# Patient Record
Sex: Male | Born: 2000 | Race: White | Hispanic: No | Marital: Single | Smoking: Never smoker
Health system: Southern US, Community
[De-identification: ages and names within clinical notes are randomized; demographics above are authoritative.]

---
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
## 2022-02-26 ENCOUNTER — Other Ambulatory Visit: Payer: Self-pay

## 2022-02-26 ENCOUNTER — Emergency Department
Admission: EM | Admit: 2022-02-26 | Discharge: 2022-02-26 | Disposition: A | Discharge status: Home / Self Care | Payer: Medicaid Other | Attending: Emergency Medicine | Admitting: Emergency Medicine

## 2022-02-26 DIAGNOSIS — K2100 Gastro-esophageal reflux disease with esophagitis, without bleeding: Secondary | ICD-10-CM | POA: Insufficient documentation

## 2022-02-26 DIAGNOSIS — K219 Gastro-esophageal reflux disease without esophagitis: Secondary | ICD-10-CM

## 2022-02-26 DIAGNOSIS — E860 Dehydration: Secondary | ICD-10-CM | POA: Diagnosis not present

## 2022-02-26 DIAGNOSIS — R42 Dizziness and giddiness: Secondary | ICD-10-CM | POA: Diagnosis present

## 2022-02-26 LAB — BASIC METABOLIC PANEL
Anion gap: 10 (ref 5–15)
BUN: 9 mg/dL (ref 6–20)
CO2: 27 mmol/L (ref 22–32)
Calcium: 10 mg/dL (ref 8.9–10.3)
Chloride: 104 mmol/L (ref 98–111)
Creatinine, Ser: 0.74 mg/dL (ref 0.61–1.24)
GFR, Estimated: >60 mL/min (ref >60)
Glucose, Bld: 118 mg/dL — ABNORMAL HIGH (ref 70–99)
Potassium: 4.2 mmol/L (ref 3.5–5.1)
Sodium: 141 mmol/L (ref 135–145)

## 2022-02-26 LAB — CBC
HCT: 46.2 % (ref 39.0–52.0)
Hemoglobin: 15.4 g/dL (ref 13.0–17.0)
MCH: 30.3 pg (ref 26.0–34.0)
MCHC: 33.3 g/dL (ref 30.0–36.0)
MCV: 90.9 fL (ref 80.0–100.0)
Platelets: 377 10*3/uL (ref 150–400)
RBC: 5.08 MIL/uL (ref 4.22–5.81)
RDW: 12 % (ref 11.5–15.5)
WBC: 10.4 10*3/uL (ref 4.0–10.5)
nRBC: 0 % (ref 0.0–0.2)

## 2022-02-26 LAB — URINALYSIS, ROUTINE W REFLEX MICROSCOPIC
Bacteria, UA: NONE SEEN
Bilirubin Urine: NEGATIVE
Glucose, UA: NEGATIVE mg/dL
Hgb urine dipstick: NEGATIVE
Ketones, ur: NEGATIVE mg/dL
Leukocytes,Ua: NEGATIVE
Nitrite: NEGATIVE
Protein, ur: 100 mg/dL — AB
Specific Gravity, Urine: 1.026 (ref 1.005–1.030)
pH: 9 — ABNORMAL HIGH (ref 5.0–8.0)

## 2022-02-26 LAB — TROPONIN I (HIGH SENSITIVITY): Troponin I (High Sensitivity): <2 ng/L (ref <18)

## 2022-02-26 MED ORDER — SODIUM CHLORIDE 0.9 % IV BOLUS
1000.0000 mL | Freq: Once | INTRAVENOUS | Status: AC
  Administered 2022-02-26: 1000 mL via INTRAVENOUS

## 2022-02-26 MED ORDER — PANTOPRAZOLE SODIUM 40 MG PO TBEC: 40.0000 mg | DELAYED_RELEASE_TABLET | Freq: Every day | ORAL | 1 refills | Status: AC

## 2022-02-26 MED ORDER — ALUM & MAG HYDROXIDE-SIMETH 200-200-20 MG/5ML PO SUSP
30.0000 mL | Freq: Once | ORAL | Status: AC
  Administered 2022-02-26: 30 mL via ORAL
  Filled 2022-02-26: qty 30

## 2022-02-26 MED ORDER — LIDOCAINE VISCOUS HCL 2 % MT SOLN
15.0000 mL | Freq: Once | OROMUCOSAL | Status: AC
  Administered 2022-02-26: 15 mL via ORAL
  Filled 2022-02-26: qty 15

## 2022-02-26 NOTE — ED Provider Notes (Signed)
[facility] Provider Note    Event Date/Time   First MD Initiated Contact with Patient 02/26/22 1212     (approximate)   History   Dizziness and Gastroesophageal Reflux   HPI  James Murillo is a 21 y.o. male presents emergency department with mother.  Mother states patient has had dizziness and burning in his chest.  A lot of burping.  Symptoms started worsening last night and then he had vomiting.  Patient states the dizziness started prior to the vomiting.  He does work outside in [employer] and in the heat.  States he does try to drink a lot of water.  He denies chest pain/shortness of breath.  There is a family history of sudden death, his dad died suddenly at age 75 but did have a history of drug, alcohol, and smoking.  No other sudden death noted in the family.      Physical Exam   Triage Vital Signs: ED Triage Vitals  Enc Vitals Group     BP 02/26/22 1157 119/62     Pulse Rate 02/26/22 1157 93     Resp 02/26/22 1157 18     Temp 02/26/22 1157 98.1 F (36.7 C)     Temp Source 02/26/22 1157 Oral     SpO2 02/26/22 1157 98 %     Weight 02/26/22 1158 137 lb 3.2 oz (62.2 kg)     Height 02/26/22 1158 6\' 1"  (1.854 m)     Head Circumference --      Peak Flow --      Pain Score 02/26/22 1158 0     Pain Loc --      Pain Edu? --      Excl. in [place]? --     Most recent vital signs: Vitals:   02/26/22 1239 02/26/22 1402  BP: 119/69 115/69  Pulse: 94 (!) 110  Resp: 18 18  Temp:    SpO2: 100% 100%     General: Awake, no distress.   CV:  Good peripheral perfusion. regular rate and  rhythm Resp:  Normal effort. Lungs CTA Abd:  No distention.  Nontender Other:      ED Results / Procedures / Treatments   Labs (all labs ordered are listed, but only abnormal results are displayed) Labs Reviewed  BASIC METABOLIC PANEL - Abnormal; Notable for the following components:      Result Value   Glucose, Bld 118 (*)    All other components within  normal limits  URINALYSIS, ROUTINE W REFLEX MICROSCOPIC - Abnormal; Notable for the following components:   Color, Urine YELLOW (*)    APPearance CLEAR (*)    pH 9.0 (*)    Protein, ur 100 (*)    All other components within normal limits  CBC  TROPONIN I (HIGH SENSITIVITY)     EKG  EKG   RADIOLOGY     PROCEDURES:   Procedures   MEDICATIONS ORDERED IN ED: Medications  sodium chloride 0.9 % bolus 1,000 mL (0 mLs Intravenous Stopped 02/26/22 1401)  alum & mag hydroxide-simeth (MAALOX/MYLANTA) 200-200-20 MG/5ML suspension 30 mL (30 mLs Oral Given 02/26/22 1251)    And  lidocaine (XYLOCAINE) 2 % viscous mouth solution 15 mL (15 mLs Oral Given 02/26/22 1251)     IMPRESSION / MDM / ASSESSMENT AND PLAN / ED COURSE  I reviewed the triage vital signs and the nursing notes.  Differential diagnosis includes, but is not limited to, reflux, dehydration, vertigo, heart disease/MI  Patient's presentation is most consistent with acute complicated illness / injury requiring diagnostic workup.   Labs and EKG ordered.  I feel that a MI is less likely however due to family history nursing staff has initiated cardiac protocols.  Which I do agree with.  Patient will be given GI cocktail with viscous lidocaine, normal saline 1 L IV.  EKG showed normal sinus rhythm, labs are reassuring, all labs are normal including CBC metabolic panel, urinalysis and troponin.  Patient is feeling much better after the GI cocktail.  States the burning in his chest has gone away.  Dizziness has stopped also.  We did discuss vertigo and changes in position.  Explained to him he most likely needs a reflux medication such as Protonix.  He is to use this at night prior to his evening meal.  Drink plenty of fluids while at the worksite and at home.  Patient is in agreement treatment plan.  He was discharged stable condition with a prescription for Protonix.  He was also given a work note  for today.  He is to return if worsening.  He was discharged stable condition.        FINAL CLINICAL IMPRESSION(S) / ED DIAGNOSES   Final diagnoses:  Dehydration  Gastroesophageal reflux disease, unspecified whether esophagitis present     Rx / DC Orders   ED Discharge Orders          Ordered    pantoprazole (PROTONIX) 40 MG tablet  Daily        02/26/22 1356             Note:  This document was prepared using Dragon voice recognition software and may include unintentional dictation errors.    Faythe Ghee, PA-C 02/26/22 1611    Minna Antis, MD 02/28/22 [phone]

## 2022-02-26 NOTE — ED Notes (Signed)
Pt states his pain is much improved since medication was administered. Resting on stretcher with iv fluids infusing. No distress noted.

## 2022-02-26 NOTE — ED Notes (Signed)
Pt resting quietly on stretcher with mom at the bedside. States he is feeling better. IV fluids almost completed.

## 2022-02-26 NOTE — Discharge Instructions (Signed)
Follow-up with your regular doctor if not improving 3 days.  Return emergency department worsening.  Take the Protonix at night prior to your evening meal.

## 2022-02-26 NOTE — ED Triage Notes (Signed)
Pt here with dizziness and GERD. Pt states he started having symptoms last night and also vomited an significant amount of times. Pt denies any pain at the moment. Pt states his dizziness is intermittent.

## 2022-07-20 ENCOUNTER — Emergency Department
Admission: EM | Admit: 2022-07-20 | Discharge: 2022-07-20 | Disposition: A | Discharge status: Home / Self Care | Payer: Medicaid Other | Attending: Emergency Medicine | Admitting: Emergency Medicine

## 2022-07-20 ENCOUNTER — Encounter: Payer: Self-pay | Admitting: Emergency Medicine

## 2022-07-20 DIAGNOSIS — K029 Dental caries, unspecified: Secondary | ICD-10-CM | POA: Diagnosis not present

## 2022-07-20 DIAGNOSIS — K0889 Other specified disorders of teeth and supporting structures: Secondary | ICD-10-CM | POA: Diagnosis present

## 2022-07-20 NOTE — Discharge Instructions (Addendum)
Go to the dentist.  Please take Tylenol and ibuprofen/Advil for your pain.  It is safe to take them together, or to alternate them every few hours.  Take up to 1000mg  of Tylenol at a time, up to 4 times per day.  Do not take more than 4000 mg of Tylenol in 24 hours.  For ibuprofen, take 400-600 mg, 3 - 4 times per day.

## 2022-07-20 NOTE — ED Provider Notes (Signed)
   [facility] Provider Note    None    (approximate)   History   Dental Pain   HPI  James Murillo is a 21 y.o. male who presents to the ED for evaluation of Dental Pain   Patient presents with chronic left mandibular dental pain.  He has not seen a dentist.  He has tried using some OTC gel for the pain.  His mom urged him to get checked out and go to the doctor because it was still hurting.  No fevers or trauma.   Physical Exam   Triage Vital Signs: ED Triage Vitals  Enc Vitals Group     BP 07/20/22 0511 125/76     Pulse Rate 07/20/22 0511 79     Resp 07/20/22 0511 17     Temp 07/20/22 0511 98.2 F (36.8 C)     Temp Source 07/20/22 0511 Oral     SpO2 07/20/22 0511 100 %     Weight 07/20/22 0511 138 lb 14.2 oz (63 kg)     Height 07/20/22 0511 6' (1.829 m)     Head Circumference --      Peak Flow --      Pain Score 07/20/22 0510 9     Pain Loc --      Pain Edu? --      Excl. in [place]? --     Most recent vital signs: Vitals:   07/20/22 0511  BP: 125/76  Pulse: 79  Resp: 17  Temp: 98.2 F (36.8 C)  SpO2: 100%    General: Awake, no distress.  CV:  Good peripheral perfusion.  Resp:  Normal effort.  Abd:  No distention.  MSK:  No deformity noted.  Neuro:  No focal deficits appreciated. Other:  Carious tooth #19.  No surrounding erythema or swelling.   ED Results / Procedures / Treatments   Labs (all labs ordered are listed, but only abnormal results are displayed) Labs Reviewed - No data to display  EKG   RADIOLOGY   Official radiology report(s): No results found.  PROCEDURES and INTERVENTIONS:  Procedures  Medications - No data to display   IMPRESSION / MDM / [place] / ED COURSE  I reviewed the triage vital signs and the nursing notes.  Differential diagnosis includes, but is not limited to, dental abscess, fractured tooth, trauma, chronic pain  Patient presents with subacute atraumatic dental pain  suitable for outpatient dental follow-up.  Looks well without signs of infection.  No indications to initiate antibiotics.  We discussed pain control, provided dental resources and we discussed appropriate use of the ED.     FINAL CLINICAL IMPRESSION(S) / ED DIAGNOSES   Final diagnoses:  Dental caries  Tooth pain     Rx / DC Orders   ED Discharge Orders     None        Note:  This document was prepared using Dragon voice recognition software and may include unintentional dictation errors.   Vladimir Crofts, MD 07/20/22 [phone]

## 2022-07-20 NOTE — ED Triage Notes (Signed)
Pt presents via POV with complaints of left sided lower dental pain that started several days ago. He notes having a "cracked tooth" causing some discomfort. No meds taken PTA.
